# Patient Record
Sex: Female | Born: 1965 | Hispanic: No | Marital: Married | Smoking: Never smoker
Health system: Southern US, Community
[De-identification: ages and names within clinical notes are randomized; demographics above are authoritative.]

## PROBLEM LIST (undated history)

## (undated) DIAGNOSIS — R7303 Prediabetes: Secondary | ICD-10-CM

## (undated) DIAGNOSIS — E7800 Pure hypercholesterolemia, unspecified: Secondary | ICD-10-CM

## (undated) DIAGNOSIS — I499 Cardiac arrhythmia, unspecified: Secondary | ICD-10-CM

## (undated) DIAGNOSIS — G4700 Insomnia, unspecified: Secondary | ICD-10-CM

## (undated) HISTORY — PX: VEIN SURGERY: SHX48

## (undated) HISTORY — PX: ABDOMINOPLASTY: SUR9

---

## 2019-06-03 ENCOUNTER — Other Ambulatory Visit: Payer: Self-pay | Admitting: Internal Medicine

## 2019-06-03 DIAGNOSIS — Z0000 Encounter for general adult medical examination without abnormal findings: Secondary | ICD-10-CM

## 2019-06-03 DIAGNOSIS — R1011 Right upper quadrant pain: Secondary | ICD-10-CM

## 2019-06-13 ENCOUNTER — Ambulatory Visit: Payer: Self-pay

## 2019-06-14 ENCOUNTER — Ambulatory Visit
Admission: RE | Admit: 2019-06-14 | Discharge: 2019-06-14 | Disposition: A | Discharge status: Home / Self Care | Payer: BC Managed Care – PPO | Source: Ambulatory Visit | Attending: Internal Medicine | Admitting: Internal Medicine

## 2019-06-14 ENCOUNTER — Other Ambulatory Visit: Payer: Self-pay

## 2019-06-14 DIAGNOSIS — R1011 Right upper quadrant pain: Secondary | ICD-10-CM | POA: Insufficient documentation

## 2019-06-14 DIAGNOSIS — Z0000 Encounter for general adult medical examination without abnormal findings: Secondary | ICD-10-CM | POA: Diagnosis present

## 2019-06-14 MED ORDER — IOHEXOL 300 MG/ML  SOLN
100.0000 mL | Freq: Once | INTRAMUSCULAR | Status: AC | PRN
  Administered 2019-06-14: 100 mL via INTRAVENOUS

## 2019-07-06 ENCOUNTER — Other Ambulatory Visit: Payer: Self-pay | Admitting: Obstetrics and Gynecology

## 2019-07-06 DIAGNOSIS — Z1231 Encounter for screening mammogram for malignant neoplasm of breast: Secondary | ICD-10-CM

## 2019-08-11 ENCOUNTER — Ambulatory Visit
Admission: RE | Admit: 2019-08-11 | Discharge: 2019-08-11 | Disposition: A | Discharge status: Home / Self Care | Payer: BC Managed Care – PPO | Source: Ambulatory Visit | Attending: Obstetrics and Gynecology | Admitting: Obstetrics and Gynecology

## 2019-08-11 DIAGNOSIS — Z1231 Encounter for screening mammogram for malignant neoplasm of breast: Secondary | ICD-10-CM | POA: Diagnosis present

## 2019-08-24 ENCOUNTER — Other Ambulatory Visit: Payer: Self-pay | Admitting: Obstetrics and Gynecology

## 2019-08-24 DIAGNOSIS — R928 Other abnormal and inconclusive findings on diagnostic imaging of breast: Secondary | ICD-10-CM

## 2019-08-24 DIAGNOSIS — N6489 Other specified disorders of breast: Secondary | ICD-10-CM

## 2019-08-31 ENCOUNTER — Other Ambulatory Visit: Payer: Self-pay

## 2019-08-31 ENCOUNTER — Ambulatory Visit
Admission: RE | Admit: 2019-08-31 | Discharge: 2019-08-31 | Disposition: A | Discharge status: Home / Self Care | Payer: BC Managed Care – PPO | Source: Ambulatory Visit | Attending: Obstetrics and Gynecology | Admitting: Obstetrics and Gynecology

## 2019-08-31 DIAGNOSIS — R928 Other abnormal and inconclusive findings on diagnostic imaging of breast: Secondary | ICD-10-CM | POA: Diagnosis present

## 2019-08-31 DIAGNOSIS — N6489 Other specified disorders of breast: Secondary | ICD-10-CM | POA: Diagnosis present

## 2019-09-13 ENCOUNTER — Encounter: Payer: Self-pay | Admitting: *Deleted

## 2019-09-19 ENCOUNTER — Other Ambulatory Visit: Payer: Self-pay

## 2019-09-19 ENCOUNTER — Other Ambulatory Visit
Admission: RE | Admit: 2019-09-19 | Discharge: 2019-09-19 | Disposition: A | Discharge status: Home / Self Care | Payer: BC Managed Care – PPO | Source: Ambulatory Visit | Attending: Ophthalmology | Admitting: Ophthalmology

## 2019-09-19 DIAGNOSIS — Z20828 Contact with and (suspected) exposure to other viral communicable diseases: Secondary | ICD-10-CM | POA: Diagnosis not present

## 2019-09-19 DIAGNOSIS — Z01812 Encounter for preprocedural laboratory examination: Secondary | ICD-10-CM | POA: Diagnosis present

## 2019-09-20 LAB — SARS CORONAVIRUS 2 (TAT 6-24 HRS): SARS Coronavirus 2: NEGATIVE

## 2019-09-22 ENCOUNTER — Ambulatory Visit: Payer: BC Managed Care – PPO | Admitting: Anesthesiology

## 2019-09-22 ENCOUNTER — Other Ambulatory Visit: Payer: Self-pay

## 2019-09-22 ENCOUNTER — Encounter
Admission: RE | Disposition: A | Discharge status: Home / Self Care | Payer: Self-pay | Source: Home / Self Care | Attending: Ophthalmology

## 2019-09-22 ENCOUNTER — Ambulatory Visit
Admission: RE | Admit: 2019-09-22 | Discharge: 2019-09-22 | Disposition: A | Discharge status: Home / Self Care | Payer: BC Managed Care – PPO | Attending: Ophthalmology | Admitting: Ophthalmology

## 2019-09-22 DIAGNOSIS — E7800 Pure hypercholesterolemia, unspecified: Secondary | ICD-10-CM | POA: Diagnosis not present

## 2019-09-22 DIAGNOSIS — G4700 Insomnia, unspecified: Secondary | ICD-10-CM | POA: Insufficient documentation

## 2019-09-22 DIAGNOSIS — H2511 Age-related nuclear cataract, right eye: Secondary | ICD-10-CM | POA: Insufficient documentation

## 2019-09-22 HISTORY — DX: Insomnia, unspecified: G47.00

## 2019-09-22 HISTORY — DX: Cardiac arrhythmia, unspecified: I49.9

## 2019-09-22 HISTORY — PX: CATARACT EXTRACTION W/PHACO: SHX586

## 2019-09-22 HISTORY — DX: Pure hypercholesterolemia, unspecified: E78.00

## 2019-09-22 SURGERY — PHACOEMULSIFICATION, CATARACT, WITH IOL INSERTION
Anesthesia: Monitor Anesthesia Care | Site: Eye | Laterality: Right

## 2019-09-22 MED ORDER — CYCLOPENTOLATE HCL 2 % OP SOLN
1.0000 [drp] | OPHTHALMIC | Status: DC | PRN
  Administered 2019-09-22: 11:00:00 1 [drp] via OPHTHALMIC

## 2019-09-22 MED ORDER — ONDANSETRON HCL 4 MG/2ML IJ SOLN
INTRAMUSCULAR | Status: DC | PRN
  Administered 2019-09-22: 4 mg via INTRAVENOUS

## 2019-09-22 MED ORDER — TRYPAN BLUE 0.06 % OP SOLN
OPHTHALMIC | Status: DC | PRN
  Administered 2019-09-22: 0.5 mL via INTRAOCULAR

## 2019-09-22 MED ORDER — EPINEPHRINE PF 1 MG/ML IJ SOLN
INTRAOCULAR | Status: DC | PRN
  Administered 2019-09-22: 11:00:00 via OPHTHALMIC

## 2019-09-22 MED ORDER — PHENYLEPHRINE HCL 10 % OP SOLN
1.0000 [drp] | OPHTHALMIC | Status: DC | PRN
  Administered 2019-09-22: 11:00:00 1 [drp] via OPHTHALMIC

## 2019-09-22 MED ORDER — PROVISC 10 MG/ML IO SOLN
INTRAOCULAR | Status: DC | PRN
  Administered 2019-09-22: .85 mL via INTRAOCULAR

## 2019-09-22 MED ORDER — DORZOLAMIDE HCL-TIMOLOL MAL 2-0.5 % OP SOLN
OPHTHALMIC | Status: DC | PRN
  Administered 2019-09-22: 1 [drp] via OPHTHALMIC

## 2019-09-22 MED ORDER — MOXIFLOXACIN HCL 0.5 % OP SOLN: 1.0000 [drp] | Freq: Once | OPHTHALMIC | Status: DC

## 2019-09-22 MED ORDER — ARMC OPHTHALMIC DILATING DROPS
OPHTHALMIC | Status: AC
  Administered 2019-09-22: 1 via OPHTHALMIC
  Filled 2019-09-22: qty 0.5

## 2019-09-22 MED ORDER — POVIDONE-IODINE 5 % OP SOLN
OPHTHALMIC | Status: DC | PRN
  Administered 2019-09-22: 1 via OPHTHALMIC

## 2019-09-22 MED ORDER — SODIUM CHLORIDE 0.9 % IV SOLN
INTRAVENOUS | Status: DC
  Administered 2019-09-22: 10:00:00 via INTRAVENOUS

## 2019-09-22 MED ORDER — PHENYLEPHRINE HCL 10 % OP SOLN
OPHTHALMIC | Status: AC
  Administered 2019-09-22: 1 [drp] via OPHTHALMIC
  Filled 2019-09-22: qty 5

## 2019-09-22 MED ORDER — ONDANSETRON HCL 4 MG/2ML IJ SOLN
INTRAMUSCULAR | Status: AC
  Filled 2019-09-22: qty 2

## 2019-09-22 MED ORDER — TETRACAINE HCL 0.5 % OP SOLN
1.0000 [drp] | Freq: Two times a day (BID) | OPHTHALMIC | Status: DC
  Administered 2019-09-22: 09:00:00 1 [drp] via OPHTHALMIC

## 2019-09-22 MED ORDER — MOXIFLOXACIN HCL 0.5 % OP SOLN
OPHTHALMIC | Status: AC
  Filled 2019-09-22: qty 3

## 2019-09-22 MED ORDER — FENTANYL CITRATE (PF) 100 MCG/2ML IJ SOLN
INTRAMUSCULAR | Status: AC
  Filled 2019-09-22: qty 2

## 2019-09-22 MED ORDER — FENTANYL CITRATE (PF) 100 MCG/2ML IJ SOLN
INTRAMUSCULAR | Status: DC | PRN
  Administered 2019-09-22 (×2): 50 ug via INTRAVENOUS

## 2019-09-22 MED ORDER — MIDAZOLAM HCL 2 MG/2ML IJ SOLN
INTRAMUSCULAR | Status: AC
  Filled 2019-09-22: qty 2

## 2019-09-22 MED ORDER — NEOMYCIN-POLYMYXIN-DEXAMETH 3.5-10000-0.1 OP OINT
TOPICAL_OINTMENT | OPHTHALMIC | Status: DC | PRN
  Administered 2019-09-22: 1 via OPHTHALMIC

## 2019-09-22 MED ORDER — ARMC OPHTHALMIC DILATING DROPS
1.0000 "application " | OPHTHALMIC | Status: AC
  Administered 2019-09-22 (×3): 1 via OPHTHALMIC

## 2019-09-22 MED ORDER — MOXIFLOXACIN HCL 0.5 % OP SOLN
OPHTHALMIC | Status: DC | PRN
  Administered 2019-09-22: 0.2 mL via OPHTHALMIC

## 2019-09-22 MED ORDER — CARBACHOL 0.01 % IO SOLN
INTRAOCULAR | Status: DC | PRN
  Administered 2019-09-22: 0.5 mL via INTRAOCULAR

## 2019-09-22 MED ORDER — CYCLOPENTOLATE HCL 2 % OP SOLN
OPHTHALMIC | Status: AC
  Administered 2019-09-22: 1 [drp] via OPHTHALMIC
  Filled 2019-09-22: qty 2

## 2019-09-22 MED ORDER — NA CHONDROIT SULF-NA HYALURON 40-17 MG/ML IO SOLN
INTRAOCULAR | Status: DC | PRN
  Administered 2019-09-22: 1 mL via INTRAOCULAR

## 2019-09-22 MED ORDER — MIDAZOLAM HCL 2 MG/2ML IJ SOLN
INTRAMUSCULAR | Status: DC | PRN
  Administered 2019-09-22 (×2): 1 mg via INTRAVENOUS

## 2019-09-22 MED ORDER — LIDOCAINE HCL (PF) 4 % IJ SOLN
INTRAOCULAR | Status: DC | PRN
  Administered 2019-09-22: 4 mL via OPHTHALMIC

## 2019-09-22 MED ORDER — TETRACAINE HCL 0.5 % OP SOLN
OPHTHALMIC | Status: AC
  Administered 2019-09-22: 1 [drp] via OPHTHALMIC
  Filled 2019-09-22: qty 4

## 2019-09-22 SURGICAL SUPPLY — 19 items
BNDG EYE OVAL (GAUZE/BANDAGES/DRESSINGS) ×1 IMPLANT
DISSECTOR HYDRO NUCLEUS 50X22 (MISCELLANEOUS) ×8 IMPLANT
DRSG TEGADERM 2-3/8X2-3/4 SM (GAUZE/BANDAGES/DRESSINGS) ×2 IMPLANT
GLOVE BIOGEL M 6.5 STRL (GLOVE) ×2 IMPLANT
GOWN STRL REUS W/ TWL LRG LVL3 (GOWN DISPOSABLE) ×1 IMPLANT
GOWN STRL REUS W/ TWL XL LVL3 (GOWN DISPOSABLE) ×1 IMPLANT
GOWN STRL REUS W/TWL LRG LVL3 (GOWN DISPOSABLE) ×1
GOWN STRL REUS W/TWL XL LVL3 (GOWN DISPOSABLE) ×1
KNIFE 45D UP 2.3 (MISCELLANEOUS) ×2 IMPLANT
LABEL CATARACT MEDS ST (LABEL) ×2 IMPLANT
LENS IOL TECNIS ITEC 19.5 (Intraocular Lens) ×1 IMPLANT
PACK CATARACT (MISCELLANEOUS) ×2 IMPLANT
PACK CATARACT KING (MISCELLANEOUS) ×2 IMPLANT
PACK EYE AFTER SURG (MISCELLANEOUS) ×2 IMPLANT
SHIELD EYE LENSE ONLY DISP (GAUZE/BANDAGES/DRESSINGS) ×1 IMPLANT
SOL BSS BAG (MISCELLANEOUS) ×2
SOLUTION BSS BAG (MISCELLANEOUS) ×1 IMPLANT
WATER STERILE IRR 250ML POUR (IV SOLUTION) ×2 IMPLANT
WIPE NON LINTING 3.25X3.25 (MISCELLANEOUS) ×2 IMPLANT

## 2019-09-22 NOTE — Anesthesia Postprocedure Evaluation (Signed)
Anesthesia Post Note  Patient: Rebecca Valentine  Procedure(s) Performed: CATARACT EXTRACTION PHACO AND INTRAOCULAR LENS PLACEMENT ([place]) RIGHT VISION BLUE (Right Eye)  Patient location during evaluation: Phase II Anesthesia Type: MAC Level of consciousness: awake and alert Pain management: pain level controlled Vital Signs Assessment: post-procedure vital signs reviewed and stable Respiratory status: spontaneous breathing, nonlabored ventilation and respiratory function stable Cardiovascular status: blood pressure returned to baseline and stable Postop Assessment: no apparent nausea or vomiting Anesthetic complications: no     Last Vitals:  Vitals:   09/22/19 1120 09/22/19 1129  BP: 111/67   Pulse: (!) 55 64  Resp: 16   Temp: 36.6 C   SpO2: 99%     Last Pain:  Vitals:   09/22/19 1120  TempSrc: Temporal  PainSc: 0-No pain                 Alphonsus Sias

## 2019-09-22 NOTE — Discharge Instructions (Signed)
Eye Surgery Discharge Instructions    Expect mild scratchy sensation or mild soreness. DO NOT RUB YOUR EYE!  The day of surgery:  Minimal physical activity, but bed rest is not required  No reading, computer work, or close hand work  No bending, lifting, or straining.  May watch TV  For 24 hours:  No driving, legal decisions, or alcoholic beverages  Safety precautions  Eat anything you prefer: It is better to start with liquids, then soup then solid foods.  _____ Eye patch should be worn until postoperative exam tomorrow.  ____ Solar shield eyeglasses should be worn for comfort in the sunlight/patch while sleeping  Resume all regular medications including aspirin or Coumadin if these were discontinued prior to surgery. You may shower, bathe, shave, or wash your hair. Tylenol may be taken for mild discomfort.  Call your doctor if you experience significant pain, nausea, or vomiting, fever > 101 or other signs of infection. [phone] or [phone] Specific instructions:  Follow-up Information    Marchia Meiers, MD Follow up on 09/23/2019.   Specialty: Ophthalmology Why: @ 9:30 am for post op visit Contact information: [address] [place] [address] [phone]

## 2019-09-22 NOTE — Anesthesia Post-op Follow-up Note (Signed)
Anesthesia QCDR form completed.        

## 2019-09-22 NOTE — Anesthesia Preprocedure Evaluation (Signed)
Anesthesia Evaluation  Patient identified by MRN, date of birth, ID band Patient awake    Reviewed: Allergy & Precautions, H&P , NPO status , reviewed documented beta blocker date and time   Airway Mallampati: I  TM Distance: >3 FB Neck ROM: full    Dental  (+) Teeth Intact   Pulmonary    Pulmonary exam normal        Cardiovascular Normal cardiovascular exam+ dysrhythmias      Neuro/Psych    GI/Hepatic neg GERD  ,  Endo/Other    Renal/GU      Musculoskeletal   Abdominal   Peds  Hematology   Anesthesia Other Findings Past Medical History: No date: Dysrhythmia No date: Hypercholesterolemia No date: Insomnia  Past Surgical History: No date: ABDOMINOPLASTY No date: CESAREAN SECTION No date: VEIN SURGERY  BMI    Body Mass Index: 24.22 kg/m      Reproductive/Obstetrics                             Anesthesia Physical Anesthesia Plan  ASA: II  Anesthesia Plan: MAC   Post-op Pain Management:    Induction: Intravenous  PONV Risk Score and Plan: Treatment may vary due to age or medical condition and TIVA  Airway Management Planned: Nasal Cannula and Natural Airway  Additional Equipment:   Intra-op Plan:   Post-operative Plan:   Informed Consent: I have reviewed the patients History and Physical, chart, labs and discussed the procedure including the risks, benefits and alternatives for the proposed anesthesia with the patient or authorized representative who has indicated his/her understanding and acceptance.     Dental Advisory Given  Plan Discussed with: CRNA  Anesthesia Plan Comments:         Anesthesia Quick Evaluation

## 2019-09-22 NOTE — Op Note (Signed)
  PREOPERATIVE DIAGNOSIS:  Nuclear sclerotic cataract of the RIGHT eye.   POSTOPERATIVE DIAGNOSIS:  Nuclear sclerotic cataract of the RIGHT eye.   OPERATIVE PROCEDURE: Cataract surgery OD   SURGEON:  Marchia Meiers, MD.   ANESTHESIA:  Anesthesiologist: Alphonsus Sias, MD CRNA: Disser, Einar Grad, CRNA  1.      Managed anesthesia care. 2.     0.65ml of Shugarcaine was instilled following the paracentesis   COMPLICATIONS:  None.   TECHNIQUE:   Divide and conquer   DESCRIPTION OF PROCEDURE:  The patient was examined and consented in the preoperative holding area where the aforementioned topical anesthesia was applied to the RIGHT eye and then brought back to the Operating Room where the RIGHT eye was prepped and draped in the usual sterile ophthalmic fashion and a lid speculum was placed. A paracentesis was created with the side port blade, the anterior chamber was washed out with trypan blue to stain the anterior capsule, and the anterior chamber was filled with viscoelastic. A near clear corneal incision was performed with the steel keratome. A continuous curvilinear capsulorrhexis was performed with a cystotome followed by the capsulorrhexis forceps. Hydrodissection and hydrodelineation were carried out with BSS on a blunt cannula. The lens was removed in a divide and conquer  technique and the remaining cortical material was removed with the irrigation-aspiration handpiece. The capsular bag was inflated with viscoelastic and the lens was placed in the capsular bag without complication. The remaining viscoelastic was removed from the eye with the irrigation-aspiration handpiece. The wounds were hydrated. The anterior chamber was flushed and the eye was inflated to physiologic pressure. 0.53ml Vigamox was placed in the anterior chamber. The wounds were found to be water tight. The eye was dressed with Vigamox. The patient was given protective glasses to wear throughout the day and a shield with which  to sleep tonight. The patient was also given drops with which to begin a drop regimen today and will follow-up with me in one day. Implant Name Type Inv. Item Serial No. Manufacturer Lot No. LRB No. Used Action  LENS IOL DIOP 19.5 - [id number] Intraocular Lens LENS IOL DIOP 19.5 [id number] AMO  Right 1 Implanted    Procedure(s) with comments: CATARACT EXTRACTION PHACO AND INTRAOCULAR LENS PLACEMENT ([facility]) RIGHT VISION BLUE (Right) - [place] 00:42.6 CDE 5.10 Fluid Pack Lot # [id number]  Electronically signed: Marchia Meiers 09/22/2019 11:36 AM

## 2019-09-22 NOTE — H&P (Signed)
   I have reviewed the patient's H&P and agree with its findings. There have been no interval changes.  Nick Armel MD Ophthalmology 

## 2019-09-22 NOTE — Transfer of Care (Signed)
Immediate Anesthesia Transfer of Care Note  Patient: Rebecca Valentine  Procedure(s) Performed: CATARACT EXTRACTION PHACO AND INTRAOCULAR LENS PLACEMENT ([facility]) RIGHT VISION BLUE (Right Eye)  Patient Location: PACU  Anesthesia Type:MAC  Level of Consciousness: awake, alert  and oriented  Airway & Oxygen Therapy: Patient Spontanous Breathing  Post-op Assessment: Report given to RN and Post -op Vital signs reviewed and stable  Post vital signs: Reviewed and stable  Last Vitals:  Vitals Value Taken Time  BP    Temp    Pulse    Resp    SpO2      Last Pain:  Vitals:   09/22/19 0913  TempSrc: Tympanic  PainSc: 0-No pain         Complications: No apparent anesthesia complications

## 2019-09-23 ENCOUNTER — Encounter: Payer: Self-pay | Admitting: Ophthalmology

## 2019-11-13 IMAGING — MG MM DIGITAL DIAGNOSTIC UNILAT*R* W/ TOMO W/ CAD
4 series · 4 of 12 positions shown · non-contrast
Comparison: Previous exam(s).

CLINICAL DATA: Patient recalled from screening for right breast
asymmetry.

EXAM:
DIGITAL DIAGNOSTIC UNILATERAL RIGHT MAMMOGRAM WITH CAD AND TOMO

[R CC synth-2D]
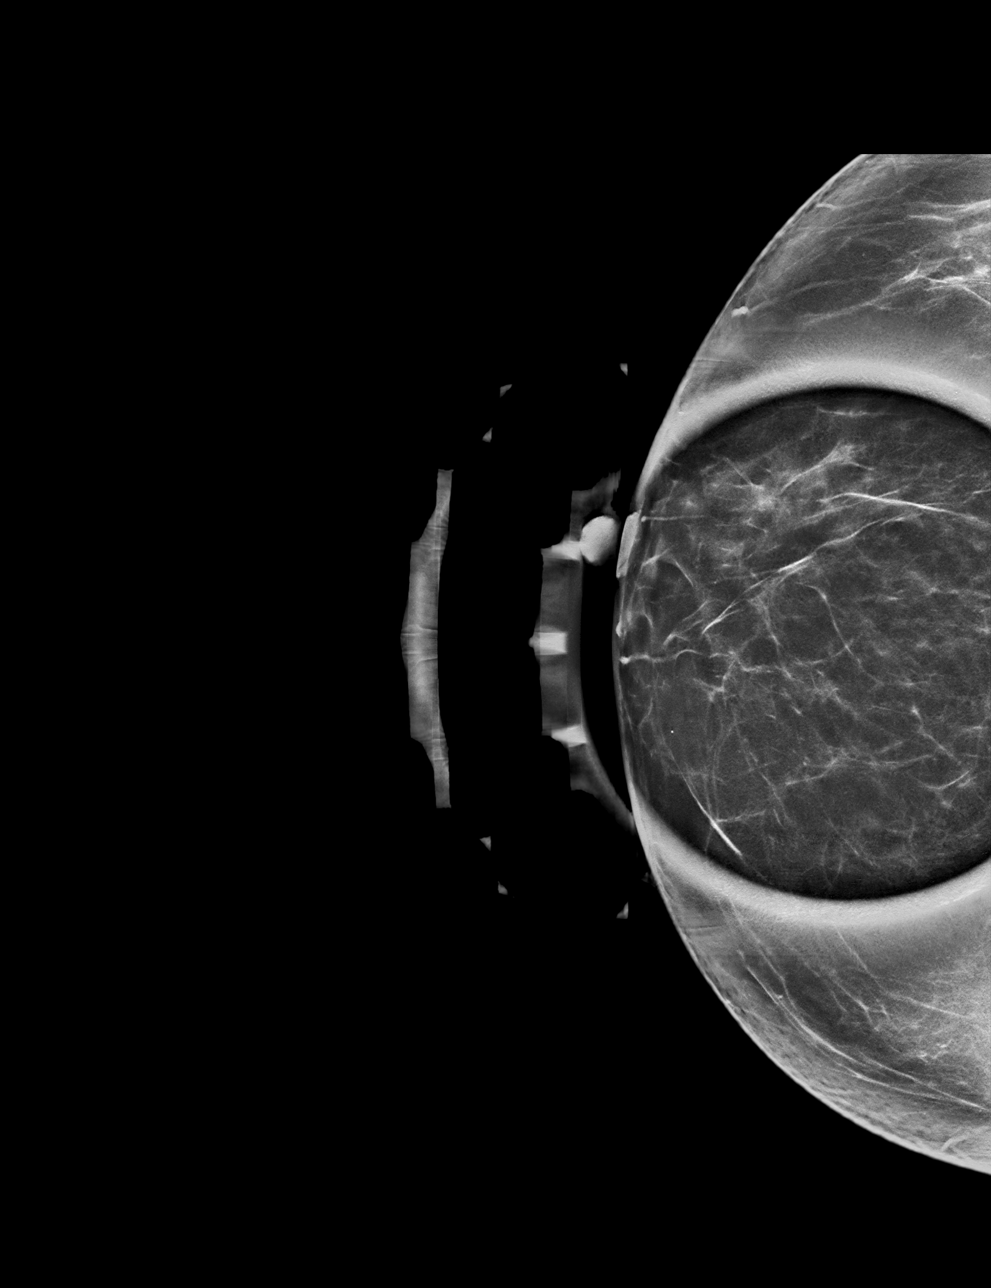

[R MLO synth-2D]
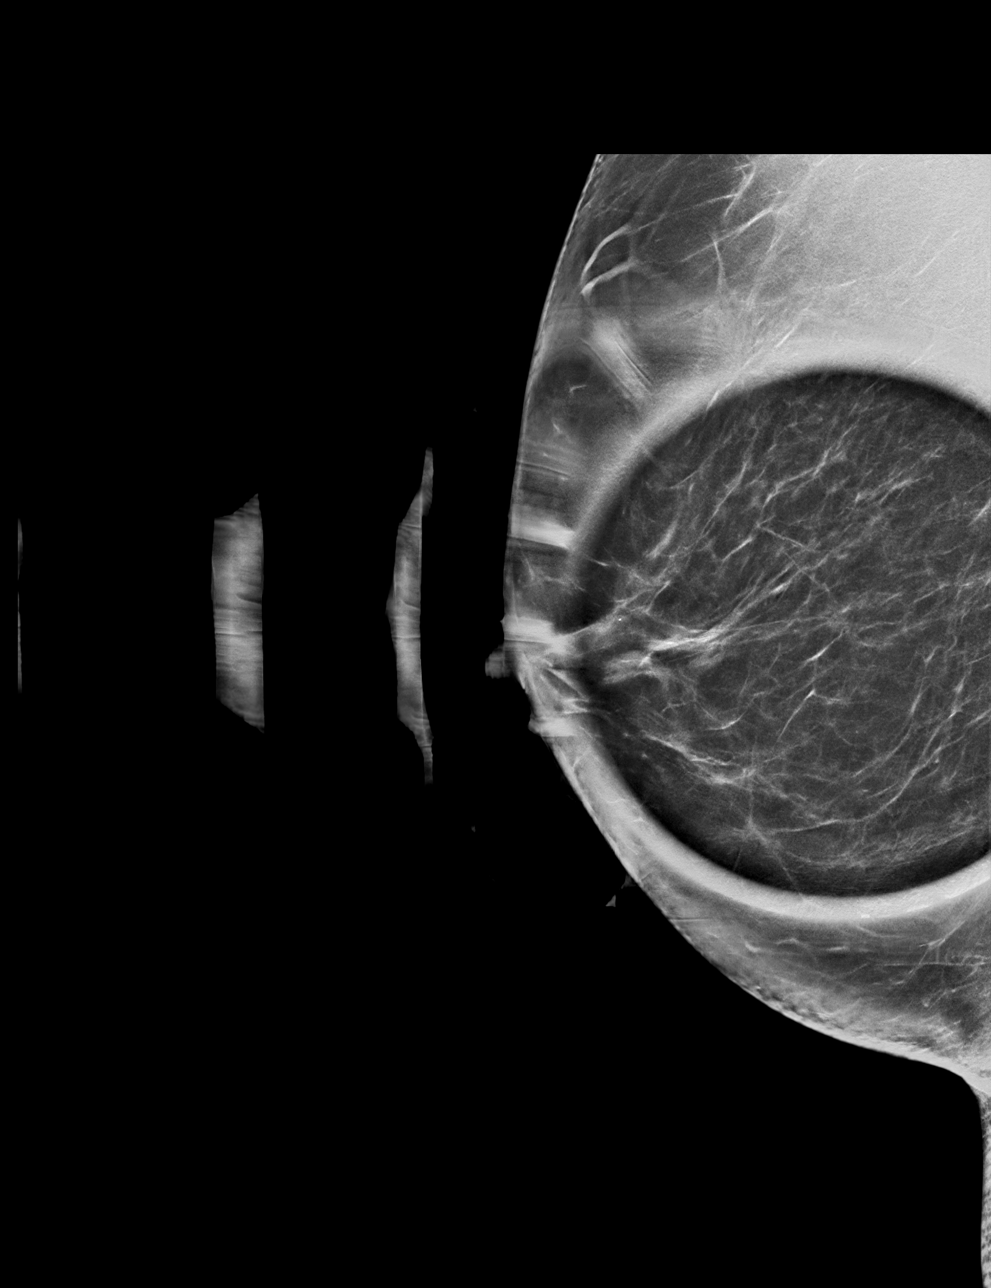

[R CC tomo · tomo slice 34/67.0]
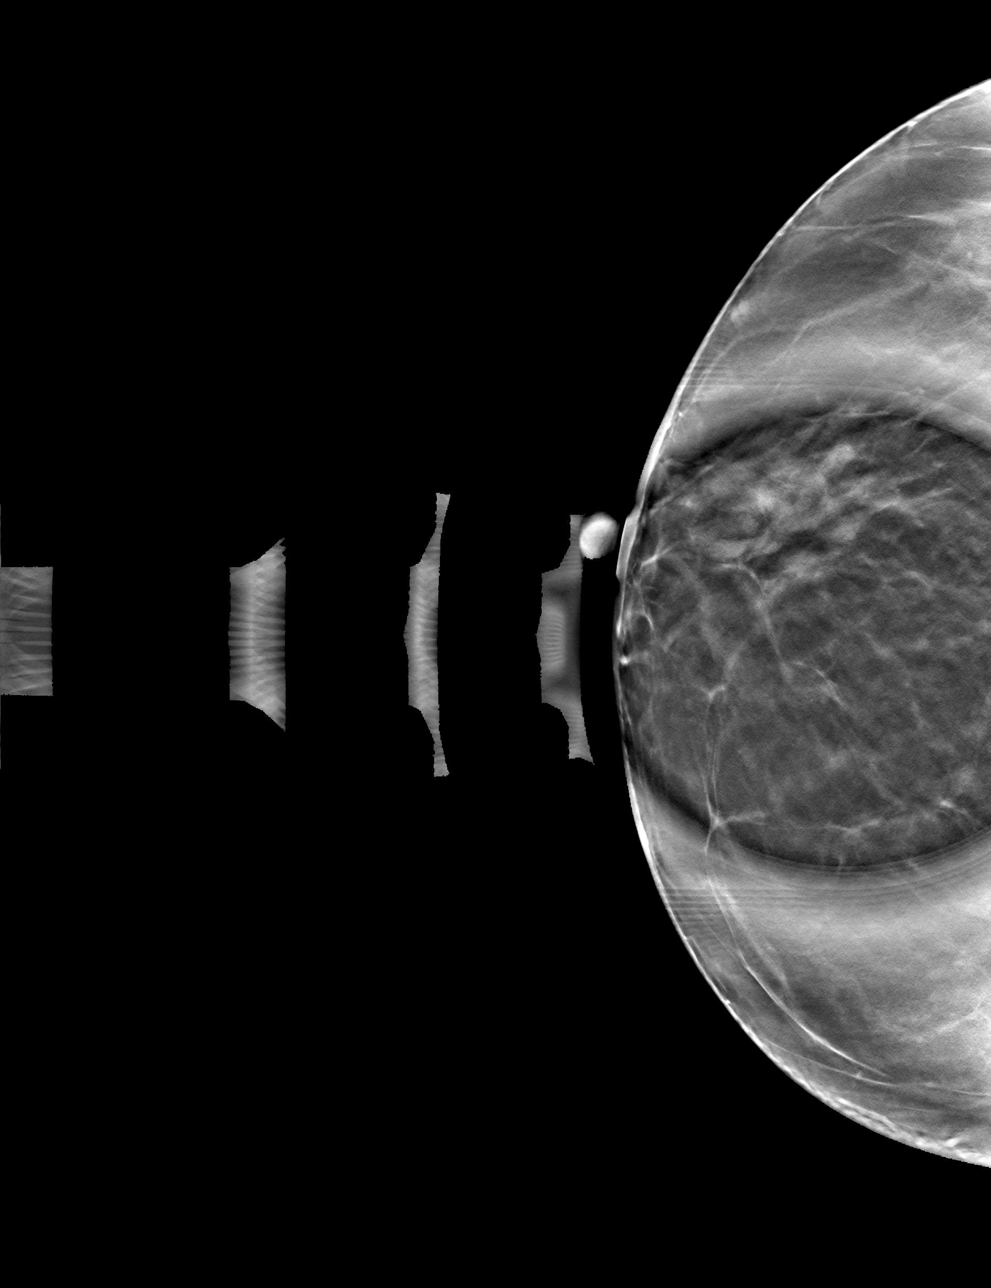

[R MLO tomo · tomo slice 41/81.0]
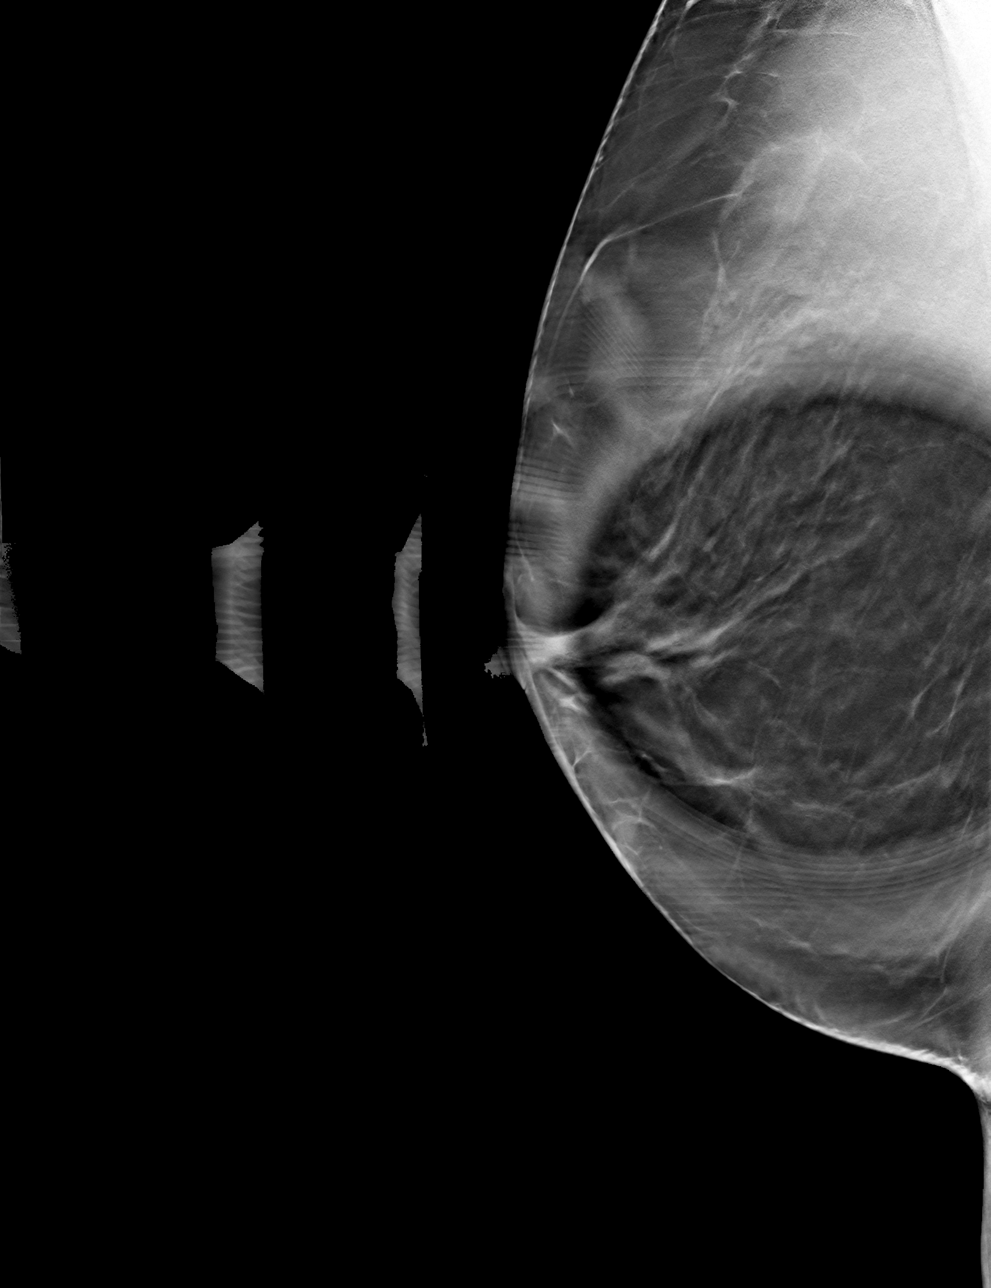

[4 of 12 positions shown; findings below may reference images not displayed]

ACR Breast Density Category c: The breast tissue is heterogeneously
dense, which may obscure small masses.
FINDINGS: Questioned asymmetry within the retroareolar right breast resolved
with additional imaging compatible with overlapping dense
fibroglandular tissue. No suspicious abnormality.

Mammographic images were processed with CAD.
IMPRESSION: No mammographic evidence for malignancy.

RECOMMENDATION:
Screening mammogram in one year.(Code:[id number]-[id number])

I have discussed the findings and recommendations with the patient.
If applicable, a reminder letter will be sent to the patient
regarding the next appointment.

BI-RADS CATEGORY  1: Negative.

## 2020-01-05 ENCOUNTER — Ambulatory Visit: Payer: BC Managed Care – PPO | Attending: Family

## 2020-01-05 DIAGNOSIS — Z23 Encounter for immunization: Secondary | ICD-10-CM

## 2020-01-05 NOTE — Progress Notes (Signed)
   Covid-19 Vaccination Clinic  Name:  Rebecca Valentine    MRN: [id number] DOB: 09/25/1966  01/05/2020  [name] was observed post Covid-19 immunization for 15 minutes without incident. She was provided with Vaccine Information Sheet and instruction to access the V-Safe system.   [name] was instructed to call 911 with any severe reactions post vaccine: Marland Kitchen Difficulty breathing  . Swelling of face and throat  . A fast heartbeat  . A bad rash all over body  . Dizziness and weakness   Immunizations Administered    Name Date Dose VIS Date Route   Moderna COVID-19 Vaccine 01/05/2020 10:28 AM 0.5 mL 09/20/2019 Intramuscular   Manufacturer: Moderna   Lot: [id number]   [place]:[id number]

## 2020-02-07 ENCOUNTER — Ambulatory Visit: Payer: BC Managed Care – PPO | Attending: Family

## 2020-02-07 DIAGNOSIS — Z23 Encounter for immunization: Secondary | ICD-10-CM

## 2020-02-07 NOTE — Progress Notes (Signed)
   Covid-19 Vaccination Clinic  Name:  Rebecca Valentine    MRN: [id number] DOB: 1966/07/05  02/07/2020  Rebecca Valentine was observed post Covid-19 immunization for 15 minutes without incident. She was provided with Vaccine Information Sheet and instruction to access the V-Safe system.   Rebecca Valentine was instructed to call 911 with any severe reactions post vaccine: Marland Kitchen Difficulty breathing  . Swelling of face and throat  . A fast heartbeat  . A bad rash all over body  . Dizziness and weakness   Immunizations Administered    Name Date Dose VIS Date Route   Moderna COVID-19 Vaccine 02/07/2020  1:44 PM 0.5 mL 09/2019 Intramuscular   Manufacturer: Moderna   Lot: [id number]   [place]:[id number]

## 2020-07-10 ENCOUNTER — Other Ambulatory Visit: Payer: Self-pay | Admitting: Obstetrics and Gynecology

## 2020-07-10 DIAGNOSIS — Z1231 Encounter for screening mammogram for malignant neoplasm of breast: Secondary | ICD-10-CM

## 2020-08-14 ENCOUNTER — Ambulatory Visit
Admission: RE | Admit: 2020-08-14 | Discharge: 2020-08-14 | Disposition: A | Discharge status: Home / Self Care | Payer: BC Managed Care – PPO | Source: Ambulatory Visit | Attending: Obstetrics and Gynecology | Admitting: Obstetrics and Gynecology

## 2020-08-14 ENCOUNTER — Other Ambulatory Visit: Payer: Self-pay

## 2020-08-14 DIAGNOSIS — Z1231 Encounter for screening mammogram for malignant neoplasm of breast: Secondary | ICD-10-CM | POA: Diagnosis present

## 2021-08-07 ENCOUNTER — Other Ambulatory Visit: Payer: Self-pay | Admitting: Obstetrics and Gynecology

## 2021-08-07 DIAGNOSIS — Z1231 Encounter for screening mammogram for malignant neoplasm of breast: Secondary | ICD-10-CM

## 2021-09-27 ENCOUNTER — Other Ambulatory Visit: Payer: Self-pay

## 2021-09-27 ENCOUNTER — Ambulatory Visit: Payer: Managed Care, Other (non HMO)

## 2021-09-27 ENCOUNTER — Ambulatory Visit (INDEPENDENT_AMBULATORY_CARE_PROVIDER_SITE_OTHER): Payer: Managed Care, Other (non HMO) | Admitting: Sports Medicine

## 2021-09-27 DIAGNOSIS — M1611 Unilateral primary osteoarthritis, right hip: Secondary | ICD-10-CM | POA: Diagnosis not present

## 2021-09-27 DIAGNOSIS — M1712 Unilateral primary osteoarthritis, left knee: Secondary | ICD-10-CM | POA: Diagnosis not present

## 2021-09-27 MED ORDER — MELOXICAM 15 MG PO TABS: ORAL_TABLET | ORAL | 3 refills | Status: AC

## 2021-09-27 NOTE — Assessment & Plan Note (Signed)
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 We discussed the pathophysiology, adding meloxicam, x-rays, home conditioning, return to see me in 4 to 6 weeks, injection if not better.

## 2021-09-27 NOTE — Progress Notes (Signed)
    Procedures performed today:    None.  Independent interpretation of notes and tests performed by another provider:   I did personally review a CT abdomen pelvis from 2020, she does have significant right-sided hip osteoarthritis, lumbar spine looks okay.  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 We discussed the pathophysiology, adding meloxicam, x-rays, home conditioning, return to see me in 4 to 6 weeks, injection if not better.  Primary osteoarthritis of right hip Significant loss of internal rotation on the right, reproduction of pain. Classic hip arthritis, adding meloxicam, home conditioning exercises, dedicated x-rays. Return to see me in 4 to 6 weeks, injections if no better.  Chronic process with exacerbation and pharmacologic intervention  ___________________________________________ Gwen Her. Dianah Field, M.D., ABFM., CAQSM. Primary Care and [place] Instructor of [employer]

## 2021-09-27 NOTE — Assessment & Plan Note (Signed)
Significant loss of internal rotation on the right, reproduction of pain. Classic hip arthritis, adding meloxicam, home conditioning exercises, dedicated x-rays. Return to see me in 4 to 6 weeks, injections if no better.

## 2021-10-25 ENCOUNTER — Ambulatory Visit: Payer: Managed Care, Other (non HMO) | Admitting: Sports Medicine

## 2021-11-28 ENCOUNTER — Ambulatory Visit
Admission: RE | Admit: 2021-11-28 | Discharge: 2021-11-28 | Disposition: A | Discharge status: Home / Self Care | Payer: Managed Care, Other (non HMO) | Source: Ambulatory Visit | Attending: Obstetrics and Gynecology | Admitting: Obstetrics and Gynecology

## 2021-11-28 ENCOUNTER — Other Ambulatory Visit: Payer: Self-pay

## 2021-11-28 DIAGNOSIS — Z1231 Encounter for screening mammogram for malignant neoplasm of breast: Secondary | ICD-10-CM | POA: Insufficient documentation

## 2022-02-08 ENCOUNTER — Other Ambulatory Visit: Payer: Self-pay | Admitting: Sports Medicine

## 2022-02-08 DIAGNOSIS — M1712 Unilateral primary osteoarthritis, left knee: Secondary | ICD-10-CM

## 2022-02-28 ENCOUNTER — Encounter
Admission: RE | Disposition: A | Discharge status: Home / Self Care | Payer: Self-pay | Source: Home / Self Care | Attending: Gastroenterology

## 2022-02-28 ENCOUNTER — Encounter: Payer: Self-pay | Admitting: *Deleted

## 2022-02-28 ENCOUNTER — Ambulatory Visit: Payer: Managed Care, Other (non HMO) | Admitting: Anesthesiology

## 2022-02-28 ENCOUNTER — Other Ambulatory Visit: Payer: Self-pay

## 2022-02-28 ENCOUNTER — Ambulatory Visit
Admission: RE | Admit: 2022-02-28 | Discharge: 2022-02-28 | Disposition: A | Discharge status: Home / Self Care | Payer: Managed Care, Other (non HMO) | Attending: Gastroenterology | Admitting: Gastroenterology

## 2022-02-28 DIAGNOSIS — K760 Fatty (change of) liver, not elsewhere classified: Secondary | ICD-10-CM | POA: Insufficient documentation

## 2022-02-28 DIAGNOSIS — K640 First degree hemorrhoids: Secondary | ICD-10-CM | POA: Insufficient documentation

## 2022-02-28 DIAGNOSIS — Q438 Other specified congenital malformations of intestine: Secondary | ICD-10-CM | POA: Diagnosis not present

## 2022-02-28 DIAGNOSIS — Z1211 Encounter for screening for malignant neoplasm of colon: Secondary | ICD-10-CM | POA: Insufficient documentation

## 2022-02-28 DIAGNOSIS — D120 Benign neoplasm of cecum: Secondary | ICD-10-CM | POA: Diagnosis not present

## 2022-02-28 HISTORY — PX: COLONOSCOPY WITH PROPOFOL: SHX5780

## 2022-02-28 SURGERY — COLONOSCOPY WITH PROPOFOL
Anesthesia: General

## 2022-02-28 MED ORDER — PROPOFOL 500 MG/50ML IV EMUL
INTRAVENOUS | Status: DC | PRN
  Administered 2022-02-28: 100 ug/kg/min via INTRAVENOUS

## 2022-02-28 MED ORDER — PROPOFOL 10 MG/ML IV BOLUS
INTRAVENOUS | Status: DC | PRN
  Administered 2022-02-28 (×5): 25 mg via INTRAVENOUS

## 2022-02-28 MED ORDER — PROPOFOL 500 MG/50ML IV EMUL
INTRAVENOUS | Status: AC
  Filled 2022-02-28: qty 50

## 2022-02-28 MED ORDER — SODIUM CHLORIDE 0.9 % IV SOLN: INTRAVENOUS | Status: DC | PRN

## 2022-02-28 MED ORDER — SODIUM CHLORIDE 0.9 % IV SOLN: INTRAVENOUS | Status: DC

## 2022-02-28 NOTE — Transfer of Care (Signed)
Immediate Anesthesia Transfer of Care Note ? ?Patient: Rebecca Valentine ? ?Procedure(s) Performed: COLONOSCOPY WITH PROPOFOL ? ?Patient Location: PACU ? ?Anesthesia Type:MAC ? ?Level of Consciousness: awake, alert  and oriented ? ?Airway & Oxygen Therapy: Patient Spontanous Breathing ? ?Post-op Assessment: Report given to RN and Post -op Vital signs reviewed and stable ? ?Post vital signs: Reviewed and stable ? ?Last Vitals:  ?Vitals Value Taken Time  ?BP 100/53 02/28/22 0927  ?Temp    ?Pulse 67 02/28/22 0928  ?Resp 14 02/28/22 0928  ?SpO2 98 % 02/28/22 0928  ?Vitals shown include unvalidated device data. ? ?Last Pain:  ?Vitals:  ? 02/28/22 0927  ?TempSrc:   ?PainSc: 0-No pain  ?   ? ?  ? ?Complications: No notable events documented. ?

## 2022-02-28 NOTE — H&P (Signed)
Outpatient short stay form Pre-procedure ?02/28/2022  ?Lesly Rubenstein, MD ? ?Primary Physician: Kirk Ruths, MD ? ?Reason for visit:  Screening ? ?History of present illness:   ? ?56 y/o lady here for screening colonoscopy. No blood thinners. No family history of GI malignancies. No significant abdominal surgeries. ? ? ? ?Current Facility-Administered Medications:  ?  0.9 %  sodium chloride infusion, , Intravenous, Continuous, Dreux Mcgroarty, Hilton Cork, MD, Last Rate: 20 mL/hr at 02/28/22 0842, Continued from Pre-op at 02/28/22 0842 ? ?Facility-Administered Medications Ordered in Other Encounters:  ?  0.9 %  sodium chloride infusion, , Intravenous, Continuous PRN, Louann Sjogren, CRNA, New Bag at 02/28/22 [phone] ? ?Medications Prior to Admission  ?Medication Sig Dispense Refill Last Dose  ? azelastine (ASTELIN) 0.1 % nasal spray Place 2 sprays into both nostrils 2 (two) times daily. Use in each nostril as directed     ? Coenzyme Q10 (UBIDECARENONE) POWD by Does not apply route.     ? lactulose (CHRONULAC) 10 GM/15ML solution Take 10 g by mouth 3 (three) times daily.     ? omega-3 fish oil (MAXEPA) 1000 MG CAPS capsule Take 1,400 mg by mouth daily.     ? propranolol (INDERAL) 10 MG tablet Take 10 mg by mouth daily.   02/27/2022  ? rosuvastatin (CRESTOR) 20 MG tablet Take 20 mg by mouth daily.   02/27/2022  ? vitamin E 200 UNIT capsule Take 100 Units by mouth daily.     ? Magnesium Citrate 100 MG TABS Take 1 tablet by mouth daily.     ? meloxicam (MOBIC) 15 MG tablet One tab PO qAM with a meal for 2 weeks, then daily prn pain. 30 tablet 3   ? traZODone (DESYREL) 50 MG tablet Take 50 mg by mouth at bedtime.     ? ? ? ?No Known Allergies ? ? ?Past Medical History:  ?Diagnosis Date  ? Dysrhythmia   ? Hypercholesterolemia   ? Insomnia   ? ? ?Review of systems:  Otherwise negative.  ? ? ?Physical Exam ? ?Gen: Alert, oriented. Appears stated age.  ?HEENT: PERRLA. ?Lungs: No respiratory distress ?CV: RRR ?Abd: soft, benign,  no masses ?Ext: No edema ? ? ? ?Planned procedures: Proceed with colonoscopy. The patient understands the nature of the planned procedure, indications, risks, alternatives and potential complications including but not limited to bleeding, infection, perforation, damage to internal organs and possible oversedation/side effects from anesthesia. The patient agrees and gives consent to proceed.  ?Please refer to procedure notes for findings, recommendations and patient disposition/instructions.  ? ? ? ?Lesly Rubenstein, MD ?[facility] ? ? ? ?  ?

## 2022-02-28 NOTE — Op Note (Signed)
[facility] ?Gastroenterology ?Patient Name: Rebecca Valentine ?Procedure Date: 02/28/2022 8:30 AM ?MRN: [id number] ?Account #: [id number] ?Date of Birth: 1966-05-28 ?Admit Type: Outpatient ?Age: 56 ?Room: [facility] ENDO ROOM 3 ?Gender: Female ?Note Status: Finalized ?Instrument Name: Colonoscope [id number] ?Procedure:             Colonoscopy ?Indications:           Screening for colorectal malignant neoplasm ?Providers:             Andrey Farmer MD, MD ?Medicines:             Monitored Anesthesia Care ?Complications:         No immediate complications. Estimated blood loss:  ?                       Minimal. ?Procedure:             Pre-Anesthesia Assessment: ?                       - Prior to the procedure, a History and Physical was  ?                       performed, and patient medications and allergies were  ?                       reviewed. The patient is competent. The risks and  ?                       benefits of the procedure and the sedation options and  ?                       risks were discussed with the patient. All questions  ?                       were answered and informed consent was obtained.  ?                       Patient identification and proposed procedure were  ?                       verified by the physician, the nurse, the  ?                       anesthesiologist, the anesthetist and the technician  ?                       in the endoscopy suite. Mental Status Examination:  ?                       alert and oriented. Airway Examination: normal  ?                       oropharyngeal airway and neck mobility. Respiratory  ?                       Examination: clear to auscultation. CV Examination:  ?                       normal. Prophylactic Antibiotics: The patient does not  ?  require prophylactic antibiotics. Prior  ?                       Anticoagulants: The patient has taken no previous  ?                       anticoagulant or antiplatelet agents. ASA  Grade  ?                       Assessment: I - A normal, healthy patient. After  ?                       reviewing the risks and benefits, the patient was  ?                       deemed in satisfactory condition to undergo the  ?                       procedure. The anesthesia plan was to use monitored  ?                       anesthesia care (MAC). Immediately prior to  ?                       administration of medications, the patient was  ?                       re-assessed for adequacy to receive sedatives. The  ?                       heart rate, respiratory rate, oxygen saturations,  ?                       blood pressure, adequacy of pulmonary ventilation, and  ?                       response to care were monitored throughout the  ?                       procedure. The physical status of the patient was  ?                       re-assessed after the procedure. ?                       After obtaining informed consent, the colonoscope was  ?                       passed under direct vision. Throughout the procedure,  ?                       the patient's blood pressure, pulse, and oxygen  ?                       saturations were monitored continuously. The  ?                       Colonoscope was introduced through the anus and  ?  advanced to the the cecum, identified by appendiceal  ?                       orifice and ileocecal valve. The colonoscopy was  ?                       somewhat difficult due to a redundant colon and a  ?                       tortuous colon. Successful completion of the procedure  ?                       was aided by applying abdominal pressure. The patient  ?                       tolerated the procedure well. The quality of the bowel  ?                       preparation was excellent. ?Findings: ?     The perianal and digital rectal examinations were normal. ?     A 2 mm polyp was found in the cecum. The polyp was sessile. The polyp  ?     was removed with a  cold snare. Resection and retrieval were complete.  ?     Estimated blood loss was minimal. ?     Internal hemorrhoids were found during retroflexion. The hemorrhoids  ?     were Grade I (internal hemorrhoids that do not prolapse). ?     The exam was otherwise without abnormality on direct and retroflexion  ?     views. ?Impression:            - One 2 mm polyp in the cecum, removed with a cold  ?                       snare. Resected and retrieved. ?                       - Internal hemorrhoids. ?                       - The examination was otherwise normal on direct and  ?                       retroflexion views. ?Recommendation:        - Discharge patient to home. ?                       - Resume previous diet. ?                       - Continue present medications. ?                       - Await pathology results. ?                       - Repeat colonoscopy in 7 years for surveillance. ?                       - Return to referring physician as previously  ?  scheduled. ?Procedure Code(s):     --- Professional --- ?                       [phone], Colonoscopy, flexible; with removal of  ?                       tumor(s), polyp(s), or other lesion(s) by snare  ?                       technique ?Diagnosis Code(s):     --- Professional --- ?                       Z12.11, Encounter for screening for malignant neoplasm  ?                       of colon ?                       K63.5, Polyp of colon ?                       K64.0, First degree hemorrhoids ?CPT copyright 2019 American Medical Association. All rights reserved. ?The codes documented in this report are preliminary and upon coder review may  ?be revised to meet current compliance requirements. ?Andrey Farmer MD, MD ?02/28/2022 9:28:28 AM ?Number of Addenda: 0 ?Note Initiated On: 02/28/2022 8:30 AM ?Scope Withdrawal Time: 0 hours 8 minutes 50 seconds  ?Total Procedure Duration: 0 hours 25 minutes 23 seconds  ?Estimated Blood Loss:   Estimated blood loss was minimal. ?     [facility] ?

## 2022-02-28 NOTE — Anesthesia Preprocedure Evaluation (Addendum)
Anesthesia Evaluation  ?Patient identified by MRN, date of birth, ID band ?Patient awake ? ? ? ?Reviewed: ?Allergy & Precautions, NPO status , Patient's Chart, lab work & pertinent test results ? ?History of Anesthesia Complications ?Negative for: history of anesthetic complications ? ?Airway ?Mallampati: II ? ? ?Neck ROM: Full ? ? ? Dental ?no notable dental hx. ? ?  ?Pulmonary ?neg pulmonary ROS,  ?  ?Pulmonary exam normal ?breath sounds clear to auscultation ? ? ? ? ? ? Cardiovascular ?Exercise Tolerance: Good ?Normal cardiovascular exam ?Rhythm:Regular Rate:Normal ? ?Palpitations  ?  ?Neuro/Psych ?negative neurological ROS ?   ? GI/Hepatic ?negative GI ROS, Fatty liver ?  ?Endo/Other  ?negative endocrine ROS ? Renal/GU ?negative Renal ROS  ? ?  ?Musculoskeletal ? ?(+) Arthritis ,  ? Abdominal ?  ?Peds ? Hematology ?  ?Anesthesia Other Findings ? ? Reproductive/Obstetrics ? ?  ? ? ? ? ? ? ? ? ? ? ? ? ? ?  ?  ? ? ? ? ? ? ? ?Anesthesia Physical ?Anesthesia Plan ? ?ASA: 2 ? ?Anesthesia Plan: General  ? ?Post-op Pain Management:   ? ?Induction: Intravenous ? ?PONV Risk Score and Plan: 3 and Propofol infusion, TIVA and Treatment may vary due to age or medical condition ? ?Airway Management Planned: Natural Airway ? ?Additional Equipment:  ? ?Intra-op Plan:  ? ?Post-operative Plan:  ? ?Informed Consent: I have reviewed the patients History and Physical, chart, labs and discussed the procedure including the risks, benefits and alternatives for the proposed anesthesia with the patient or authorized representative who has indicated his/her understanding and acceptance.  ? ? ? ?Interpreter used for interveiw ? ?Plan Discussed with: CRNA ? ?Anesthesia Plan Comments: (History and consent obtained with assistance of [place] interpreter via phone.  LMA/GETA backup discussed.  Patient consented for risks of anesthesia including but not limited to:  ?- adverse reactions to medications ?-  damage to eyes, teeth, lips or other oral mucosa ?- nerve damage due to positioning  ?- sore throat or hoarseness ?- damage to heart, brain, nerves, lungs, other parts of body or loss of life ? ?Informed patient about role of CRNA in peri- and intra-operative care.  Patient voiced understanding.)  ? ? ? ? ? ?Anesthesia Quick Evaluation ? ?

## 2022-02-28 NOTE — Anesthesia Postprocedure Evaluation (Signed)
Anesthesia Post Note ? ?Patient: Rebecca Valentine ? ?Procedure(s) Performed: COLONOSCOPY WITH PROPOFOL ? ?Patient location during evaluation: PACU ?Anesthesia Type: General ?Level of consciousness: awake and alert, oriented and patient cooperative ?Pain management: pain level controlled ?Vital Signs Assessment: post-procedure vital signs reviewed and stable ?Respiratory status: spontaneous breathing, nonlabored ventilation and respiratory function stable ?Cardiovascular status: blood pressure returned to baseline and stable ?Postop Assessment: adequate PO intake ?Anesthetic complications: no ? ? ?No notable events documented. ? ? ?Last Vitals:  ?Vitals:  ? 02/28/22 0947 02/28/22 0957  ?BP: 121/66 125/74  ?Pulse:    ?Resp: 19 16  ?Temp:    ?SpO2:    ?  ?Last Pain:  ?Vitals:  ? 02/28/22 0957  ?TempSrc:   ?PainSc: 0-No pain  ? ? ?  ?  ?  ?  ?  ?  ? ?Darrin Nipper ? ? ? ? ?

## 2022-02-28 NOTE — Interval H&P Note (Signed)
History and Physical Interval Note: ? ?02/28/2022 ?8:51 AM ? ?Rebecca Valentine  has presented today for surgery, with the diagnosis of colon cancer screening.  The various methods of treatment have been discussed with the patient and family. After consideration of risks, benefits and other options for treatment, the patient has consented to  Procedure(s): ?COLONOSCOPY WITH PROPOFOL (N/A) as a surgical intervention.  The patient's history has been reviewed, patient examined, no change in status, stable for surgery.  I have reviewed the patient's chart and labs.  Questions were answered to the patient's satisfaction.   ? ? ?[name] ? ?Ok to proceed with colonoscopy ?

## 2022-03-03 LAB — SURGICAL PATHOLOGY

## 2022-09-09 ENCOUNTER — Ambulatory Visit
Admission: RE | Admit: 2022-09-09 | Payer: Managed Care, Other (non HMO) | Source: Home / Self Care | Admitting: Ophthalmology

## 2022-09-09 ENCOUNTER — Encounter: Admission: RE | Payer: Self-pay | Source: Home / Self Care

## 2022-09-09 SURGERY — PHACOEMULSIFICATION, CATARACT, WITH IOL INSERTION
Anesthesia: Topical | Laterality: Left

## 2022-11-20 ENCOUNTER — Encounter: Payer: Self-pay | Admitting: Obstetrics and Gynecology

## 2022-11-26 ENCOUNTER — Other Ambulatory Visit: Payer: Self-pay | Admitting: Internal Medicine

## 2022-11-26 DIAGNOSIS — Z1231 Encounter for screening mammogram for malignant neoplasm of breast: Secondary | ICD-10-CM

## 2022-12-15 ENCOUNTER — Ambulatory Visit
Admission: RE | Admit: 2022-12-15 | Discharge: 2022-12-15 | Disposition: A | Discharge status: Home / Self Care | Payer: Managed Care, Other (non HMO) | Source: Ambulatory Visit | Attending: Internal Medicine | Admitting: Internal Medicine

## 2022-12-15 DIAGNOSIS — Z1231 Encounter for screening mammogram for malignant neoplasm of breast: Secondary | ICD-10-CM | POA: Insufficient documentation

## 2023-04-15 ENCOUNTER — Encounter: Payer: Self-pay | Admitting: Ophthalmology

## 2023-04-15 NOTE — Anesthesia Preprocedure Evaluation (Addendum)
Anesthesia Evaluation  Patient identified by MRN, date of birth, ID band Patient awake    Reviewed: Allergy & Precautions, H&P , NPO status , Patient's Chart, lab work & pertinent test results  Airway Mallampati: I  TM Distance: >3 FB Neck ROM: Full    Dental no notable dental hx.    Pulmonary neg pulmonary ROS   Pulmonary exam normal breath sounds clear to auscultation       Cardiovascular Normal cardiovascular exam+ dysrhythmias  Rhythm:Regular Rate:Normal     Neuro/Psych   Anxiety     Anxious, does not want to be aware if possible, explained she needs to look up and look down--administered versed 2 mg IV preop for anxiolysis, oxygen 2 L/m/n/c and on pulse oximetrynegative neurological ROS  negative psych ROS   GI/Hepatic negative GI ROS, Neg liver ROS,,,  Endo/Other  negative endocrine ROS    Renal/GU negative Renal ROS  negative genitourinary   Musculoskeletal negative musculoskeletal ROS (+) Arthritis ,    Abdominal   Peds negative pediatric ROS (+)  Hematology negative hematology ROS (+)   Anesthesia Other Findings Dysrhythmia  Hypercholesterolemia Insomnia  Pre-diabetes    Reproductive/Obstetrics negative OB ROS                             Anesthesia Physical Anesthesia Plan  ASA: 2  Anesthesia Plan: MAC   Post-op Pain Management:    Induction: Intravenous  PONV Risk Score and Plan:   Airway Management Planned: Natural Airway and Nasal Cannula  Additional Equipment:   Intra-op Plan:   Post-operative Plan:   Informed Consent: I have reviewed the patients History and Physical, chart, labs and discussed the procedure including the risks, benefits and alternatives for the proposed anesthesia with the patient or authorized representative who has indicated his/her understanding and acceptance.     Dental Advisory Given  Plan Discussed with: Anesthesiologist, CRNA  and Surgeon  Anesthesia Plan Comments: (Patient consented for risks of anesthesia including but not limited to:  - adverse reactions to medications - damage to eyes, teeth, lips or other oral mucosa - nerve damage due to positioning  - sore throat or hoarseness - Damage to heart, brain, nerves, lungs, other parts of body or loss of life  Patient voiced understanding.)        Anesthesia Quick Evaluation

## 2023-04-16 NOTE — Discharge Instructions (Signed)

## 2023-04-21 ENCOUNTER — Ambulatory Visit
Admission: RE | Admit: 2023-04-21 | Discharge: 2023-04-21 | Disposition: A | Discharge status: Home / Self Care | Payer: Managed Care, Other (non HMO) | Source: Ambulatory Visit | Attending: Ophthalmology | Admitting: Ophthalmology

## 2023-04-21 ENCOUNTER — Ambulatory Visit: Payer: Managed Care, Other (non HMO) | Admitting: Anesthesiology

## 2023-04-21 ENCOUNTER — Encounter: Payer: Self-pay | Admitting: Ophthalmology

## 2023-04-21 ENCOUNTER — Other Ambulatory Visit: Payer: Self-pay

## 2023-04-21 ENCOUNTER — Encounter
Admission: RE | Disposition: A | Discharge status: Home / Self Care | Payer: Self-pay | Source: Ambulatory Visit | Attending: Ophthalmology

## 2023-04-21 DIAGNOSIS — H2512 Age-related nuclear cataract, left eye: Secondary | ICD-10-CM | POA: Diagnosis present

## 2023-04-21 DIAGNOSIS — M1990 Unspecified osteoarthritis, unspecified site: Secondary | ICD-10-CM | POA: Insufficient documentation

## 2023-04-21 HISTORY — PX: CATARACT EXTRACTION W/PHACO: SHX586

## 2023-04-21 HISTORY — DX: Prediabetes: R73.03

## 2023-04-21 SURGERY — PHACOEMULSIFICATION, CATARACT, WITH IOL INSERTION
Anesthesia: Monitor Anesthesia Care | Site: Eye | Laterality: Left

## 2023-04-21 MED ORDER — LACTATED RINGERS IV SOLN: INTRAVENOUS | Status: DC

## 2023-04-21 MED ORDER — SIGHTPATH DOSE#1 BSS IO SOLN
INTRAOCULAR | Status: DC | PRN
  Administered 2023-04-21: 68 mL via OPHTHALMIC

## 2023-04-21 MED ORDER — TETRACAINE HCL 0.5 % OP SOLN
1.0000 [drp] | OPHTHALMIC | Status: DC | PRN
  Administered 2023-04-21 (×3): 1 [drp] via OPHTHALMIC

## 2023-04-21 MED ORDER — BRIMONIDINE TARTRATE-TIMOLOL 0.2-0.5 % OP SOLN
OPHTHALMIC | Status: DC | PRN
  Administered 2023-04-21: 1 [drp] via OPHTHALMIC

## 2023-04-21 MED ORDER — SIGHTPATH DOSE#1 BSS IO SOLN
INTRAOCULAR | Status: DC | PRN
  Administered 2023-04-21: 2 mL

## 2023-04-21 MED ORDER — MIDAZOLAM HCL 2 MG/2ML IJ SOLN
INTRAMUSCULAR | Status: DC | PRN
  Administered 2023-04-21: 1 mg via INTRAVENOUS

## 2023-04-21 MED ORDER — SIGHTPATH DOSE#1 NA CHONDROIT SULF-NA HYALURON 40-17 MG/ML IO SOLN
INTRAOCULAR | Status: DC | PRN
  Administered 2023-04-21: 1 mL via INTRAOCULAR

## 2023-04-21 MED ORDER — SIGHTPATH DOSE#1 BSS IO SOLN
INTRAOCULAR | Status: DC | PRN
  Administered 2023-04-21: 15 mL via INTRAOCULAR

## 2023-04-21 MED ORDER — MOXIFLOXACIN HCL 0.5 % OP SOLN
OPHTHALMIC | Status: DC | PRN
  Administered 2023-04-21: .2 mL via OPHTHALMIC

## 2023-04-21 MED ORDER — FENTANYL CITRATE (PF) 100 MCG/2ML IJ SOLN
INTRAMUSCULAR | Status: DC | PRN
  Administered 2023-04-21: 50 ug via INTRAVENOUS

## 2023-04-21 MED ORDER — MIDAZOLAM HCL 2 MG/2ML IJ SOLN
2.0000 mg | Freq: Once | INTRAMUSCULAR | Status: AC
  Administered 2023-04-21: 2 mg via INTRAVENOUS

## 2023-04-21 MED ORDER — ARMC OPHTHALMIC DILATING DROPS
1.0000 | OPHTHALMIC | Status: DC | PRN
  Administered 2023-04-21 (×3): 1 via OPHTHALMIC

## 2023-04-21 SURGICAL SUPPLY — 11 items
ANGLE REVERSE CUT SHRT 25GA (CUTTER) ×1
CATARACT SUITE SIGHTPATH (MISCELLANEOUS) ×1 IMPLANT
CYSTOTOME ANGL RVRS SHRT 25G (CUTTER) ×1 IMPLANT
CYSTOTOME ANGL RVRS SHRT 25GA (CUTTER) ×1 IMPLANT
FEE CATARACT SUITE SIGHTPATH (MISCELLANEOUS) ×1 IMPLANT
GLOVE BIOGEL PI IND STRL 8 (GLOVE) ×1 IMPLANT
GLOVE SURG ENC TEXT LTX SZ8 (GLOVE) ×1 IMPLANT
LENS IOL TECNIS EYHANCE 19.5 (Intraocular Lens) IMPLANT
NDL FILTER BLUNT 18X1 1/2 (NEEDLE) ×1 IMPLANT
NEEDLE FILTER BLUNT 18X1 1/2 (NEEDLE) ×1 IMPLANT
SYR 3ML LL SCALE MARK (SYRINGE) ×1 IMPLANT

## 2023-04-21 NOTE — Op Note (Signed)
PREOPERATIVE DIAGNOSIS:  Nuclear sclerotic cataract of the left eye.   POSTOPERATIVE DIAGNOSIS:  Nuclear sclerotic cataract of the left eye.   OPERATIVE PROCEDURE:ORPROCALL@   SURGEON:  Galen Manila, MD.   ANESTHESIA:  Anesthesiologist: Marisue Humble, MD CRNA: Barbette Hair, CRNA  1.      Managed anesthesia care. 2.     0.59ml of Shugarcaine was instilled following the paracentesis   COMPLICATIONS:  None.   TECHNIQUE:   Stop and chop   DESCRIPTION OF PROCEDURE:  The patient was examined and consented in the preoperative holding area where the aforementioned topical anesthesia was applied to the left eye and then brought back to the Operating Room where the left eye was prepped and draped in the usual sterile ophthalmic fashion and a lid speculum was placed. A paracentesis was created with the side port blade and the anterior chamber was filled with viscoelastic. A near clear corneal incision was performed with the steel keratome. A continuous curvilinear capsulorrhexis was performed with a cystotome followed by the capsulorrhexis forceps. Hydrodissection and hydrodelineation were carried out with BSS on a blunt cannula. The lens was removed in a stop and chop  technique and the remaining cortical material was removed with the irrigation-aspiration handpiece. The capsular bag was inflated with viscoelastic and the Technis DIB00 lens was placed in the capsular bag without complication. The remaining viscoelastic was removed from the eye with the irrigation-aspiration handpiece. The wounds were hydrated. The anterior chamber was flushed with BSS and the eye was inflated to physiologic pressure. 0.28ml Vigamox was placed in the anterior chamber. The wounds were found to be water tight. The eye was dressed with Combigan. The patient was given protective glasses to wear throughout the day and a shield with which to sleep tonight. The patient was also given drops with which to begin a drop regimen  today and will follow-up with me in one day. Implant Name Type Inv. Item Serial No. Manufacturer Lot No. LRB No. Used Action  LENS IOL TECNIS EYHANCE 19.5 - [id number] Intraocular Lens LENS IOL TECNIS EYHANCE 19.5 [id number] [employer]  Left 1 Implanted    Procedure(s): CATARACT EXTRACTION PHACO AND INTRAOCULAR LENS PLACEMENT (IOC) LEFT 7.34 00:40.9 (Left)  Electronically signed: Galen Manila 04/21/2023 8:52 AM

## 2023-04-21 NOTE — H&P (Signed)
[facility]   Primary Care Physician:  Lauro Regulus, MD Ophthalmologist: Dr. Druscilla Brownie  Pre-Procedure History & Physical: HPI:  Rebecca Valentine is a 57 y.o. female here for cataract surgery.   Past Medical History:  Diagnosis Date   Dysrhythmia    Palpitations in the past   Hypercholesterolemia    Insomnia    Pre-diabetes     Past Surgical History:  Procedure Laterality Date   ABDOMINOPLASTY     CATARACT EXTRACTION W/PHACO Right 09/22/2019   Procedure: CATARACT EXTRACTION PHACO AND INTRAOCULAR LENS PLACEMENT (IOC) RIGHT VISION BLUE;  Surgeon: Elliot Cousin, MD;  Location: [facility] ORS;  Service: Ophthalmology;  Laterality: Right;  [place] 00:42.6 CDE 5.10 Fluid Pack Lot # [id number]   CESAREAN SECTION     COLONOSCOPY WITH PROPOFOL N/A 02/28/2022   Procedure: COLONOSCOPY WITH PROPOFOL;  Surgeon: Regis Bill, MD;  Location: [facility] ENDOSCOPY;  Service: Endoscopy;  Laterality: N/A;   VEIN SURGERY      Prior to Admission medications   Medication Sig Start Date End Date Taking? Authorizing Provider  Cholecalciferol (VITAMIN D-3 PO) Take by mouth daily.   Yes [provider]  Coenzyme Q10 (UBIDECARENONE) POWD by Does not apply route.   Yes [provider]  Cyanocobalamin (VITAMIN B-12 PO) Take by mouth daily.   Yes [provider]  Magnesium Citrate 100 MG TABS Take 1 tablet by mouth daily.   Yes [provider]  Melatonin 10 MG TABS Take by mouth at bedtime.   Yes [provider]  omega-3 fish oil (MAXEPA) 1000 MG CAPS capsule Take 1,400 mg by mouth daily.   Yes [provider]  rosuvastatin (CRESTOR) 20 MG tablet Take 20 mg by mouth daily.   Yes [provider]  traZODone (DESYREL) 50 MG tablet Take 50 mg by mouth at bedtime.   Yes [provider]  vitamin E 200 UNIT capsule Take 100 Units by mouth daily.   Yes [provider]  azelastine (ASTELIN) 0.1 % nasal spray Place 2 sprays into both  nostrils 2 (two) times daily. Use in each nostril as directed    [provider]  lactulose (CHRONULAC) 10 GM/15ML solution Take 10 g by mouth 3 (three) times daily. Patient not taking: Reported on 04/15/2023    [provider]  meloxicam (MOBIC) 15 MG tablet One tab PO qAM with a meal for 2 weeks, then daily prn pain. Patient not taking: Reported on 04/15/2023 09/27/21   Monica Becton, MD  propranolol (INDERAL) 10 MG tablet Take 10 mg by mouth daily. Patient not taking: Reported on 04/15/2023    [provider]    Allergies as of 03/10/2023   (No Known Allergies)    Family History  Problem Relation Age of Onset   Breast cancer Neg Hx     Social History   Socioeconomic History   Marital status: Married    Spouse name: Not on file   Number of children: Not on file   Years of education: Not on file   Highest education level: Not on file  Occupational History   Not on file  Tobacco Use   Smoking status: Never   Smokeless tobacco: Never  Vaping Use   Vaping Use: Never used  Substance and Sexual Activity   Alcohol use: Never   Drug use: Not on file   Sexual activity: Not on file  Other Topics Concern   Not on file  Social History Narrative   Not  on file   Social Determinants of Health   Financial Resource Strain: Not on file  Food Insecurity: Not on file  Transportation Needs: Not on file  Physical Activity: Not on file  Stress: Not on file  Social Connections: Not on file  Intimate Partner Violence: Not on file    Review of Systems: See HPI, otherwise negative ROS  Physical Exam: BP 111/71   Temp 97.9 F (36.6 C) (Temporal)   Ht 5' 7.01" (1.702 m)   Wt 75.8 kg   LMP 06/13/2014 (Approximate)   SpO2 97%   BMI 26.15 kg/m  General:   Alert, cooperative in NAD Head:  Normocephalic and atraumatic. Respiratory:  Normal work of breathing. Cardiovascular:  RRR  Impression/Plan: Rebecca Valentine is here for cataract  surgery.  Risks, benefits, limitations, and alternatives regarding cataract surgery have been reviewed with the patient.  Questions have been answered.  All parties agreeable.   Galen Manila, MD  04/21/2023, 8:29 AM

## 2023-04-21 NOTE — Transfer of Care (Signed)
Immediate Anesthesia Transfer of Care Note  Patient: Rebecca Valentine  Procedure(s) Performed: CATARACT EXTRACTION PHACO AND INTRAOCULAR LENS PLACEMENT (IOC) LEFT 7.34 00:40.9 (Left: Eye)  Patient Location: PACU  Anesthesia Type: MAC  Level of Consciousness: awake, alert  and patient cooperative  Airway and Oxygen Therapy: Patient Spontanous Breathing and Patient connected to supplemental oxygen  Post-op Assessment: Post-op Vital signs reviewed, Patient's Cardiovascular Status Stable, Respiratory Function Stable, Patent Airway and No signs of Nausea or vomiting  Post-op Vital Signs: Reviewed and stable  Complications: No notable events documented.

## 2023-04-21 NOTE — Anesthesia Postprocedure Evaluation (Signed)
Anesthesia Post Note  Patient: Rebecca Valentine  Procedure(s) Performed: CATARACT EXTRACTION PHACO AND INTRAOCULAR LENS PLACEMENT (IOC) LEFT 7.34 00:40.9 (Left: Eye)  Patient location during evaluation: PACU Anesthesia Type: MAC Level of consciousness: awake and alert Pain management: pain level controlled Vital Signs Assessment: post-procedure vital signs reviewed and stable Respiratory status: spontaneous breathing, nonlabored ventilation, respiratory function stable and patient connected to nasal cannula oxygen Cardiovascular status: stable and blood pressure returned to baseline Postop Assessment: no apparent nausea or vomiting Anesthetic complications: no   No notable events documented.   Last Vitals:  Vitals:   04/21/23 0855 04/21/23 0859  BP: (!) 112/51 116/85  Pulse: 82 66  Resp: 12 14  Temp: 36.4 C 36.4 C  SpO2: 94% 95%    Last Pain:  Vitals:   04/21/23 0859  TempSrc:   PainSc: 0-No pain                 Marisue Humble

## 2023-04-22 ENCOUNTER — Encounter: Payer: Self-pay | Admitting: Ophthalmology

## 2023-05-21 ENCOUNTER — Ambulatory Visit: Payer: Managed Care, Other (non HMO) | Admitting: Dermatology

## 2024-06-21 ENCOUNTER — Encounter: Payer: Self-pay | Admitting: Sports Medicine
